# Patient Record
Sex: Female | Born: 1980 | Race: White | Hispanic: No | Smoking: Current every day smoker
Health system: Southern US, Community
[De-identification: ages and names within clinical notes are randomized; demographics above are authoritative.]

## PROBLEM LIST (undated history)

## (undated) DIAGNOSIS — I10 Essential (primary) hypertension: Secondary | ICD-10-CM

## (undated) DIAGNOSIS — M1990 Unspecified osteoarthritis, unspecified site: Secondary | ICD-10-CM

## (undated) DIAGNOSIS — J342 Deviated nasal septum: Secondary | ICD-10-CM

## (undated) DIAGNOSIS — I209 Angina pectoris, unspecified: Secondary | ICD-10-CM

## (undated) DIAGNOSIS — M719 Bursopathy, unspecified: Secondary | ICD-10-CM

## (undated) DIAGNOSIS — F419 Anxiety disorder, unspecified: Secondary | ICD-10-CM

## (undated) HISTORY — PX: WISDOM TOOTH EXTRACTION: SHX21

## (undated) HISTORY — PX: CERVIX REMOVAL: SHX592

## (undated) HISTORY — PX: APPENDECTOMY: SHX54

## (undated) HISTORY — PX: OVARIAN CYST REMOVAL: SHX89

## (undated) HISTORY — PX: HEEL SPUR SURGERY: SHX665

## (undated) HISTORY — PX: URETHRAL DILATION: SUR417

## (undated) HISTORY — PX: TUBAL LIGATION: SHX77

## (undated) HISTORY — PX: OTHER SURGICAL HISTORY: SHX169

---

## 2013-06-12 ENCOUNTER — Emergency Department (HOSPITAL_COMMUNITY)
Admission: EM | Admit: 2013-06-12 | Discharge: 2013-06-13 | Disposition: A | Discharge status: Home / Self Care | Payer: 59 | Attending: Emergency Medicine | Admitting: Emergency Medicine

## 2013-06-12 ENCOUNTER — Emergency Department (HOSPITAL_COMMUNITY): Payer: 59

## 2013-06-12 ENCOUNTER — Encounter (HOSPITAL_COMMUNITY): Payer: Self-pay | Admitting: *Deleted

## 2013-06-12 DIAGNOSIS — M7150 Other bursitis, not elsewhere classified, unspecified site: Secondary | ICD-10-CM | POA: Insufficient documentation

## 2013-06-12 DIAGNOSIS — Z79899 Other long term (current) drug therapy: Secondary | ICD-10-CM | POA: Insufficient documentation

## 2013-06-12 DIAGNOSIS — Z888 Allergy status to other drugs, medicaments and biological substances status: Secondary | ICD-10-CM | POA: Insufficient documentation

## 2013-06-12 DIAGNOSIS — M6281 Muscle weakness (generalized): Secondary | ICD-10-CM | POA: Insufficient documentation

## 2013-06-12 DIAGNOSIS — M5412 Radiculopathy, cervical region: Secondary | ICD-10-CM | POA: Insufficient documentation

## 2013-06-12 DIAGNOSIS — M5410 Radiculopathy, site unspecified: Secondary | ICD-10-CM

## 2013-06-12 DIAGNOSIS — I10 Essential (primary) hypertension: Secondary | ICD-10-CM | POA: Insufficient documentation

## 2013-06-12 DIAGNOSIS — F17200 Nicotine dependence, unspecified, uncomplicated: Secondary | ICD-10-CM | POA: Insufficient documentation

## 2013-06-12 DIAGNOSIS — R209 Unspecified disturbances of skin sensation: Secondary | ICD-10-CM | POA: Insufficient documentation

## 2013-06-12 HISTORY — DX: Bursopathy, unspecified: M71.9

## 2013-06-12 HISTORY — DX: Essential (primary) hypertension: I10

## 2013-06-12 NOTE — ED Notes (Signed)
Pt state she woke up with neck and spine pain and into right shoulder blade and has been consistent for the last five days.  Both arms keep going numb and it feels like something keeps pulling her shoulders out of her joint.  Pain goes down arm and into elbow joint and wrist

## 2013-06-13 ENCOUNTER — Encounter (HOSPITAL_COMMUNITY): Payer: Self-pay | Admitting: *Deleted

## 2013-06-13 ENCOUNTER — Emergency Department (HOSPITAL_COMMUNITY): Payer: 59

## 2013-06-13 LAB — BASIC METABOLIC PANEL
GFR calc non Af Amer: >90 mL/min (ref >90)
Glucose, Bld: 103 mg/dL — ABNORMAL HIGH (ref 70–99)
Potassium: 3.5 mEq/L (ref 3.5–5.1)
Sodium: 138 mEq/L (ref 135–145)

## 2013-06-13 LAB — CBC
Hemoglobin: 14.8 g/dL (ref 12.0–15.0)
MCHC: 35.2 g/dL (ref 30.0–36.0)
RBC: 4.62 MIL/uL (ref 3.87–5.11)
WBC: 7.1 10*3/uL (ref 4.0–10.5)

## 2013-06-13 LAB — C-REACTIVE PROTEIN: CRP: <0.5 mg/dL — ABNORMAL LOW (ref <0.60)

## 2013-06-13 LAB — SEDIMENTATION RATE: Sed Rate: 5 mm/hr (ref 0–22)

## 2013-06-13 MED ORDER — OXYCODONE-ACETAMINOPHEN 5-325 MG PO TABS
2.0000 | ORAL_TABLET | Freq: Once | ORAL | Status: AC
  Administered 2013-06-13: 2 via ORAL
  Filled 2013-06-13: qty 2

## 2013-06-13 MED ORDER — MORPHINE SULFATE 4 MG/ML IJ SOLN
4.0000 mg | Freq: Once | INTRAMUSCULAR | Status: AC
  Administered 2013-06-13: 4 mg via INTRAVENOUS
  Filled 2013-06-13: qty 1

## 2013-06-13 MED ORDER — GADOBENATE DIMEGLUMINE 529 MG/ML IV SOLN
10.0000 mL | Freq: Once | INTRAVENOUS | Status: AC
  Administered 2013-06-13: 10 mL via INTRAVENOUS

## 2013-06-13 MED ORDER — OXYCODONE-ACETAMINOPHEN 5-325 MG PO TABS: 2.0000 | ORAL_TABLET | ORAL | Status: DC | PRN

## 2013-06-13 MED ORDER — ONDANSETRON HCL 4 MG PO TABS
4.0000 mg | ORAL_TABLET | Freq: Once | ORAL | Status: AC
  Administered 2013-06-13: 4 mg via ORAL
  Filled 2013-06-13: qty 1

## 2013-06-13 MED ORDER — METHYLPREDNISOLONE 4 MG PO KIT: PACK | ORAL | Status: DC

## 2013-06-13 NOTE — ED Provider Notes (Signed)
Care assumed from Dr. Lavella Lemons at shift change awaiting mri results.  The study was performed and shows a C5-C6 disc protrusion.  I have discussed this with Dr. Gerlene Fee who will see the patient in follow up on Monday.  She is to call to schedule this appointment.    Geoffery Lyons, MD 06/13/13 1004

## 2013-06-13 NOTE — ED Notes (Signed)
Report taken on Patient, will attempt to move patient to pod C because of delay in MRI.

## 2013-06-13 NOTE — ED Notes (Signed)
Pt states with movement there is still some pain; however, the nausea has diminished.

## 2013-06-13 NOTE — ED Provider Notes (Signed)
History    CSN: [id number] Arrival date & time 06/12/13  1839  First MD Initiated Contact with Patient 06/12/13 2357     Chief Complaint  Patient presents with  . Shoulder Pain   (Consider location/radiation/quality/duration/timing/severity/associated sxs/prior Treatment) HPI Patient is a 32 yo woman who awoke 5d ago with pain in midline of the c spine which radiated to the right shoulder. Pain has been severe and persistent since then. Pain radiates to the elbow and wrist. Patient feels numbness in in both arms to the ends of each of her finger tips. The patient feels weakness in her right arm. Her grip strength is diminished. She feels like her right shoulder is less elevated than her left when she looks in the mirror.   She has worsening of pain with movements of the neck. However, her pain is constant. Denies recent injury. Says she felt "a little feverish" today. Notes a remote history of c spine fx - approx 8 yrs ago - but says this was not treated surgically.    Past Medical History  Diagnosis Date  . Bursitis   . Hypertension    Past Surgical History  Procedure Laterality Date  . Fractured vertebrate in neck     No family history on file. History  Substance Use Topics  . Smoking status: Current Every Day Smoker  . Smokeless tobacco: Not on file  . Alcohol Use: No   OB History   Grav Para Term Preterm Abortions TAB SAB Ect Mult Living                 Review of Systems Gen: no weight loss, fevers, chills, night sweats Eyes: no discharge or drainage, no occular pain or visual changes Nose: no epistaxis or rhinorrhea Mouth: no dental pain, no sore throat Neck: no neck pain Lungs: no SOB, cough, wheezing CV: no chest pain, palpitations, dependent edema or orthopnea Abd: no abdominal pain, nausea, vomiting GU: no dysuria or gross hematuria MSK: As per history of present illness, otherwise negative Neuro: As per history of present illness, otherwise  negative Skin: no rash Psyche: negative.  Allergies  Amlodipine  Home Medications   Current Outpatient Rx  Name  Route  Sig  Dispense  Refill  . diazepam (VALIUM) 5 MG tablet   Oral   Take 5 mg by mouth every 6 (six) hours as needed (for muscle spasms).         Marland Kitchen ibuprofen (ADVIL,MOTRIN) 200 MG tablet   Oral   Take 400 mg by mouth every 6 (six) hours as needed for pain.         . metoprolol tartrate (LOPRESSOR) 25 MG tablet   Oral   Take 25 mg by mouth daily.          BP 166/104  Pulse 77  Temp(Src) 98.7 F (37.1 C) (Oral)  Resp 20  SpO2 100%  LMP 06/09/2013 Physical Exam Gen: well developed and well nourished appearing Head: NCAT Eyes: PERL, EOMI Nose: no epistaixis or rhinorrhea Mouth/throat: mucosa is moist and pink Neck: no midline c spine ttp Lungs: CTA B, no wheezing, rhonchi or rales CV: RRR, no murmur Abd: soft, notender, nondistended Back: ttp over the superior T spine approx T1-T3 level with ttp over the right paraspinal muscualture Skin: no rashese, wnl Neuro: CN ii-xii grossly intact, UE: normal shoulder elevation bilaterally, 5/5 biceps left , 4+ biceps right, 5/5 triceps left, 4+/5 triceps left, mildly diminished finger and hand strength right, normal left,  normal DTRs bilaterally, LE: 5/5 motor strength bilaterally all major muscle groups.  Psyche; normal affect,  calm and cooperative.   ED Course  Procedures (including critical care time) Labs Reviewed - No data to display Dg Shoulder Right  06/12/2013   *RADIOLOGY REPORT*  Clinical Data: Pain and numbness  RIGHT SHOULDER - 2+ VIEW  Comparison: None.  Findings: Negative for fracture, dislocation, or other acute abnormality.  Normal alignment and mineralization. No significant degenerative change.  Regional soft tissues unremarkable.  IMPRESSION:  Negative   Original Report Authenticated By: D. Andria Rhein, MD   No diagnosis found.  MDM  Patient with exam concern for acute spinal cord  compression or myelopathy. MRI of the C and T spine are pending. Case signed out to Dr. Laveda Norman at change of shift who will follow up on results of MRI and disposition patient.    Brandt Loosen, MD 06/13/13 [phone]

## 2013-06-13 NOTE — ED Notes (Signed)
Patient is complaining that her shoulder pain is returning.  Denies any chest pain.  Aware of plan to have MRI this morning.  Will request additional pain medications

## 2013-06-13 NOTE — ED Notes (Signed)
Patient remains in MRI,  Additional pain meds given due to patient having difficulty laying still for MRI.

## 2013-06-18 ENCOUNTER — Emergency Department (HOSPITAL_COMMUNITY)
Admission: EM | Admit: 2013-06-18 | Discharge: 2013-06-19 | Disposition: A | Discharge status: Home / Self Care | Payer: 59 | Attending: Emergency Medicine | Admitting: Emergency Medicine

## 2013-06-18 ENCOUNTER — Encounter (HOSPITAL_COMMUNITY): Payer: Self-pay | Admitting: *Deleted

## 2013-06-18 ENCOUNTER — Emergency Department (HOSPITAL_COMMUNITY): Payer: 59

## 2013-06-18 DIAGNOSIS — Z888 Allergy status to other drugs, medicaments and biological substances status: Secondary | ICD-10-CM | POA: Insufficient documentation

## 2013-06-18 DIAGNOSIS — I10 Essential (primary) hypertension: Secondary | ICD-10-CM | POA: Insufficient documentation

## 2013-06-18 DIAGNOSIS — M549 Dorsalgia, unspecified: Secondary | ICD-10-CM | POA: Insufficient documentation

## 2013-06-18 DIAGNOSIS — Z8739 Personal history of other diseases of the musculoskeletal system and connective tissue: Secondary | ICD-10-CM | POA: Insufficient documentation

## 2013-06-18 DIAGNOSIS — R209 Unspecified disturbances of skin sensation: Secondary | ICD-10-CM | POA: Insufficient documentation

## 2013-06-18 DIAGNOSIS — M542 Cervicalgia: Secondary | ICD-10-CM | POA: Insufficient documentation

## 2013-06-18 DIAGNOSIS — F17200 Nicotine dependence, unspecified, uncomplicated: Secondary | ICD-10-CM | POA: Insufficient documentation

## 2013-06-18 DIAGNOSIS — Z79899 Other long term (current) drug therapy: Secondary | ICD-10-CM | POA: Insufficient documentation

## 2013-06-18 LAB — BASIC METABOLIC PANEL
CO2: 29 mEq/L (ref 19–32)
Calcium: 9.2 mg/dL (ref 8.4–10.5)
Glucose, Bld: 109 mg/dL — ABNORMAL HIGH (ref 70–99)
Potassium: 3.9 mEq/L (ref 3.5–5.1)
Sodium: 138 mEq/L (ref 135–145)

## 2013-06-18 LAB — CBC
Hemoglobin: 15.3 g/dL — ABNORMAL HIGH (ref 12.0–15.0)
MCH: 32.1 pg (ref 26.0–34.0)
RBC: 4.76 MIL/uL (ref 3.87–5.11)

## 2013-06-18 MED ORDER — OXYCODONE-ACETAMINOPHEN 5-325 MG PO TABS: 1.0000 | ORAL_TABLET | Freq: Four times a day (QID) | ORAL | Status: AC | PRN

## 2013-06-18 MED ORDER — OXYCODONE-ACETAMINOPHEN 5-325 MG PO TABS
2.0000 | ORAL_TABLET | Freq: Once | ORAL | Status: AC
  Administered 2013-06-18: 2 via ORAL
  Filled 2013-06-18: qty 2

## 2013-06-18 NOTE — ED Provider Notes (Signed)
History    CSN: [id number] Arrival date & time 06/18/13  2040  First MD Initiated Contact with Patient 06/18/13 2150     Chief Complaint  Patient presents with  . Back Pain  . Chest Pain   (Consider location/radiation/quality/duration/timing/severity/associated sxs/prior Treatment) Patient is a 32 y.o. female presenting with back pain.  Back Pain Pain location: neck. Quality: sharp. Radiates to: right shoulder, right chest. Pain severity:  Severe Onset quality:  Sudden Duration:  7 days Timing:  Constant Progression:  Worsening Context comment:  Evaluated in ED several days ago found to have cervical disc protrusion Relieved by: percocet. Exacerbated by: movement. Associated symptoms: tingling   Associated symptoms: no abdominal pain, no bladder incontinence, no bowel incontinence, no chest pain, no fever, no numbness and no weakness    Past Medical History  Diagnosis Date  . Bursitis   . Hypertension    Past Surgical History  Procedure Laterality Date  . Fractured vertebrate in neck    . Tubal ligation    . Ovarian cyst removal    . Cervix removal      states portioon  . Urethral dilation    . Heel spur surgery     No family history on file. History  Substance Use Topics  . Smoking status: Current Every Day Smoker  . Smokeless tobacco: Not on file  . Alcohol Use: No   OB History   Grav Para Term Preterm Abortions TAB SAB Ect Mult Living                 Review of Systems  Constitutional: Negative for fever.  HENT: Negative for congestion.   Respiratory: Negative for cough and shortness of breath.   Cardiovascular: Negative for chest pain.  Gastrointestinal: Negative for nausea, vomiting, abdominal pain, diarrhea and bowel incontinence.  Genitourinary: Negative for bladder incontinence.  Musculoskeletal: Positive for back pain.  Neurological: Positive for tingling. Negative for weakness and numbness.  All other systems reviewed and are  negative.    Allergies  Amlodipine  Home Medications   Current Outpatient Rx  Name  Route  Sig  Dispense  Refill  . diazepam (VALIUM) 5 MG tablet   Oral   Take 5 mg by mouth every 6 (six) hours as needed (for muscle spasms).         Marland Kitchen ibuprofen (ADVIL,MOTRIN) 200 MG tablet   Oral   Take 400 mg by mouth every 6 (six) hours as needed for pain.         . methylPREDNISolone (MEDROL DOSEPAK) 4 MG tablet      Take as directed   21 tablet   0   . metoprolol tartrate (LOPRESSOR) 25 MG tablet   Oral   Take 25 mg by mouth daily.         Marland Kitchen oxyCODONE-acetaminophen (PERCOCET) 5-325 MG per tablet   Oral   Take 2 tablets by mouth every 4 (four) hours as needed for pain.   16 tablet   0    BP 193/114  Pulse 103  Temp(Src) 98.4 F (36.9 C) (Oral)  Resp 16  SpO2 94%  LMP 06/09/2013 Physical Exam  Nursing note and vitals reviewed. Constitutional: She is oriented to person, place, and time. She appears well-developed and well-nourished. No distress.  HENT:  Head: Normocephalic and atraumatic.  Mouth/Throat: Oropharynx is clear and moist.  Eyes: Conjunctivae are normal. Pupils are equal, round, and reactive to light. No scleral icterus.  Neck: Neck supple. Spinous process  tenderness and muscular tenderness present. Decreased range of motion (due to pain) present.  Cardiovascular: Normal rate, regular rhythm and intact distal pulses.   Pulmonary/Chest: Effort normal. No stridor. No respiratory distress.  Abdominal: Soft. Bowel sounds are normal.  Neurological: She is alert and oriented to person, place, and time. She has normal strength. No sensory deficit. Gait normal.  Normal strength and sensation of bilateral upper extremities.    Skin: Skin is warm and dry. No rash noted.  Psychiatric: She has a normal mood and affect. Her behavior is normal.    ED Course  Procedures (including critical care time) Labs Reviewed  CBC - Abnormal; Notable for the following:     Hemoglobin 15.3 (*)    All other components within normal limits  BASIC METABOLIC PANEL - Abnormal; Notable for the following:    Glucose, Bld 109 (*)    GFR calc non Af Amer 89 (*)    All other components within normal limits   Dg Chest 2 View  06/18/2013   *RADIOLOGY REPORT*  Clinical Data: Back pain and chest pain.  CHEST - 2 VIEW  Comparison: MRI of the thoracic spine 06/13/2013.  Findings: The heart size is normal.  The lungs are clear.  The visualized soft tissues and bony thorax are unremarkable.  IMPRESSION: Negative two-view chest.   Original Report Authenticated By: Marin Roberts, M.D.   EKG - NSR, rate 92, rightward axis, normal intervals, no ST elevations or depressions, similar to prior.    1. Neck pain     MDM  32 yo female with neck pain for 1 week with disc protrusion seen on MR several days ago.  She has follow up with neurosurgery tomorrow.  She ran out of pain meds today and pain has since been intolerable.  Normal strength and sensation.  No fevers or other red flags.  Percocet improved pain in ED.  Workup for chest pain initiated in triage, however pt's chest pain is actually neck pain radiating to chest.  No concern for ACS or PE.  Given enough percocet to make it to appointment tomorrow.  Return precautions given.   Candyce Churn, MD 06/19/13 [phone]

## 2013-06-18 NOTE — ED Notes (Signed)
Pt c/o back pain that she was seen on Monday for. She has appointment with neurosurgeon tomorrow. PAin radiates into her chest, with nausea, HA, lightheadedness and weakness.

## 2013-06-19 ENCOUNTER — Other Ambulatory Visit: Payer: Self-pay | Admitting: Neurosurgery

## 2013-06-19 NOTE — ED Notes (Signed)
Pt has ride home.

## 2013-06-20 ENCOUNTER — Encounter (HOSPITAL_COMMUNITY): Payer: Self-pay | Admitting: Pharmacy Technician

## 2013-06-26 NOTE — Pre-Procedure Instructions (Signed)
Zilah Villaflor  06/26/2013   Your procedure is scheduled on: June 30, 2013 at 12:56 PM  Report to [facility] at 11:00 AM.  Call this number if you have problems the morning of surgery: [phone]   Remember:   Do not eat food or drink liquids after midnight.   Take these medicines the morning of surgery with A SIP OF WATER: cetirizine (ZYRTEC), gabapentin (NEURONTIN), metoprolol tartrate (LOPRESSOR), oxyCODONE-acetaminophen (PERCOCET/ROXICET)       Do not wear jewelry, make-up or nail polish.  Do not wear lotions, powders, or perfumes. You may wear deodorant.  Do not shave 48 hours prior to surgery.   Do not bring valuables to the hospital.  [facility] is not responsible                   for any belongings or valuables.  Contacts, dentures or bridgework may not be worn into surgery.  Leave suitcase in the car. After surgery it may be brought to your room.  For patients admitted to the hospital, checkout time is 11:00 AM the day of  discharge.    Special Instructions: Shower using CHG 2 nights before surgery and the night before surgery.  If you shower the day of surgery use CHG.  Use special wash - you have one bottle of CHG for all showers.  You should use approximately 1/3 of the bottle for each shower.   Please read over the following fact sheets that you were given: Pain Booklet, Coughing and Deep Breathing, MRSA Information and Surgical Site Infection Prevention

## 2013-06-27 ENCOUNTER — Encounter (HOSPITAL_COMMUNITY)
Admission: RE | Admit: 2013-06-27 | Discharge: 2013-06-27 | Disposition: A | Discharge status: Home / Self Care | Payer: 59 | Source: Ambulatory Visit | Attending: Neurosurgery | Admitting: Neurosurgery

## 2013-06-27 ENCOUNTER — Encounter (HOSPITAL_COMMUNITY): Payer: Self-pay

## 2013-06-27 HISTORY — DX: Deviated nasal septum: J34.2

## 2013-06-27 HISTORY — DX: Anxiety disorder, unspecified: F41.9

## 2013-06-27 HISTORY — DX: Unspecified osteoarthritis, unspecified site: M19.90

## 2013-06-27 HISTORY — DX: Angina pectoris, unspecified: I20.9

## 2013-06-27 LAB — HCG, SERUM, QUALITATIVE: Preg, Serum: NEGATIVE

## 2013-06-27 NOTE — Progress Notes (Signed)
SPOKE WITH ALLISON ABOUT PATIENT BEING IN ED 06/18/13 FOR PAIN IN CHEST, NECK, BACK  WAS EVALUATED WITH CXR, EKG,LABS.  WAS THOUGHT TO BE HAVING MUSCLE PAIN.  WILL REQUEST ECHO, LAST OV, EKG FROM PATIENT'S MEDICAL PCP IN [place] (DR. Herbert Moors)  [phone].

## 2013-06-28 ENCOUNTER — Encounter (HOSPITAL_COMMUNITY): Payer: Self-pay

## 2013-06-28 NOTE — Progress Notes (Signed)
Anesthesia Chart Review:  Patient is a 32 year old female scheduled for C5-6, C6-7 ACDF on 06/30/13 by Dr. Beverlyn Roux. She recently moved to the area and does not have a PCP yet.  History includes smoking, anxiety, HTN, arthritis, deviated septum, c-spine fracture ~ 8 years ago that was not treated surgically.  Of note, she was evaluated in the ED on 06/19/13 for neck pain and chest pain after running out of pain meds.  She was evaluated by Dr. Blake Divine (notes in Epic) who described her chest pain as radiating from her neck.  Pain was felt likely due to cervical radiculopathy.  He felt there was "no concern for ACS or PE" and she was treated for pain medications as she already had appointment with NS the next day.   EKG on 06/18/13 showed NSR, RAE, rightward axis, pulmonary disease pattern, non-specific ST abnormality.    Echo on 11/18/12 (for evaluation of palpitations) done at [facility] (ordered by her PCP at the time Dr. Wilnette Kales) showed: Grossly normal LV size, normal wall motion, EF 60-65%.  Normal RV size and systolic function.  Small LA.  No ASD.  Trace MR.  Mild TR.  MRI of the C/T spine on 06/13/13 showed: C5-6 broad-based protrusion with slight caudal extension. Spinal stenosis with mild cord flattening. Mild bilateral foraminal narrowing. C6-7 broad-based protrusion slightly more notable to the right. Spinal stenosis and cord flattening greater on the right. Moderate right-sided and mild left-sided foraminal narrowing. Altered signal intensity surrounding the C6-7 disc space most likely degenerative in origin as noted above. T6-7 small left paracentral cephalad extending disc protrusion with mild left-sided cord flattening. T7-8 tiny left paracentral protrusion. T8-9 bulge/shallow protrusion slightly greater to left. Minimal curvature thoracic spine. Top normal size subcarinal lymph node with short axis dimension of 1 cm.  She had a negative 2V CXR on 06/18/13.  Labs from  06/18/13 and 06/27/13 noted.  Patient will be evaluated by her assigned anesthesiologist on the day of surgery.  Anticipate that she will be able to proceed as planned.  Velna Ochs [facility]/Anesthesiology Phone [phone] 06/28/2013 11:09 AM

## 2013-06-29 MED ORDER — CEFAZOLIN SODIUM-DEXTROSE 2-3 GM-% IV SOLR
2.0000 g | INTRAVENOUS | Status: AC
  Administered 2013-06-30: 2 g via INTRAVENOUS
  Filled 2013-06-29: qty 50

## 2013-06-30 ENCOUNTER — Ambulatory Visit (HOSPITAL_COMMUNITY): Payer: 59 | Admitting: Anesthesiology

## 2013-06-30 ENCOUNTER — Ambulatory Visit (HOSPITAL_COMMUNITY)
Admission: RE | Admit: 2013-06-30 | Discharge: 2013-07-01 | Disposition: A | Discharge status: Home / Self Care | Payer: 59 | Source: Ambulatory Visit | Attending: Neurosurgery | Admitting: Neurosurgery

## 2013-06-30 ENCOUNTER — Encounter (HOSPITAL_COMMUNITY)
Admission: RE | Disposition: A | Discharge status: Home / Self Care | Payer: Self-pay | Source: Ambulatory Visit | Attending: Neurosurgery

## 2013-06-30 ENCOUNTER — Encounter (HOSPITAL_COMMUNITY): Payer: Self-pay | Admitting: Surgery

## 2013-06-30 ENCOUNTER — Encounter (HOSPITAL_COMMUNITY): Payer: Self-pay | Admitting: Vascular Surgery

## 2013-06-30 ENCOUNTER — Ambulatory Visit (HOSPITAL_COMMUNITY): Payer: 59

## 2013-06-30 DIAGNOSIS — M5020 Other cervical disc displacement, unspecified cervical region: Secondary | ICD-10-CM | POA: Insufficient documentation

## 2013-06-30 DIAGNOSIS — Z79899 Other long term (current) drug therapy: Secondary | ICD-10-CM | POA: Insufficient documentation

## 2013-06-30 DIAGNOSIS — I10 Essential (primary) hypertension: Secondary | ICD-10-CM | POA: Insufficient documentation

## 2013-06-30 HISTORY — PX: ANTERIOR CERVICAL DECOMP/DISCECTOMY FUSION: SHX1161

## 2013-06-30 SURGERY — ANTERIOR CERVICAL DECOMPRESSION/DISCECTOMY FUSION 2 LEVELS
Anesthesia: General | Site: Neck | Wound class: Clean

## 2013-06-30 MED ORDER — GABAPENTIN 100 MG PO CAPS
200.0000 mg | ORAL_CAPSULE | Freq: Every day | ORAL | Status: DC
  Administered 2013-06-30: 200 mg via ORAL
  Filled 2013-06-30 (×2): qty 2

## 2013-06-30 MED ORDER — LORATADINE 10 MG PO TABS
10.0000 mg | ORAL_TABLET | Freq: Every day | ORAL | Status: DC
  Filled 2013-06-30: qty 1

## 2013-06-30 MED ORDER — LACTATED RINGERS IV SOLN
INTRAVENOUS | Status: DC | PRN
  Administered 2013-06-30 (×3): via INTRAVENOUS

## 2013-06-30 MED ORDER — THROMBIN 5000 UNITS EX SOLR
CUTANEOUS | Status: DC | PRN
  Administered 2013-06-30 (×2): 5000 [IU] via TOPICAL

## 2013-06-30 MED ORDER — HYDROMORPHONE HCL PF 1 MG/ML IJ SOLN: 0.2500 mg | INTRAMUSCULAR | Status: DC | PRN

## 2013-06-30 MED ORDER — METHOCARBAMOL 500 MG PO TABS
500.0000 mg | ORAL_TABLET | Freq: Four times a day (QID) | ORAL | Status: DC | PRN
  Administered 2013-06-30 – 2013-07-01 (×2): 500 mg via ORAL
  Filled 2013-06-30: qty 1

## 2013-06-30 MED ORDER — ROCURONIUM BROMIDE 100 MG/10ML IV SOLN
INTRAVENOUS | Status: DC | PRN
  Administered 2013-06-30: 50 mg via INTRAVENOUS

## 2013-06-30 MED ORDER — LACTATED RINGERS IV SOLN
INTRAVENOUS | Status: DC
  Administered 2013-06-30: 12:00:00 via INTRAVENOUS

## 2013-06-30 MED ORDER — SODIUM CHLORIDE 0.9 % IJ SOLN
3.0000 mL | Freq: Two times a day (BID) | INTRAMUSCULAR | Status: DC
  Administered 2013-06-30: 3 mL via INTRAVENOUS

## 2013-06-30 MED ORDER — DEXAMETHASONE 4 MG PO TABS
4.0000 mg | ORAL_TABLET | Freq: Four times a day (QID) | ORAL | Status: AC
  Administered 2013-06-30 (×2): 4 mg via ORAL
  Filled 2013-06-30 (×2): qty 1

## 2013-06-30 MED ORDER — SODIUM CHLORIDE 0.9 % IJ SOLN: 3.0000 mL | INTRAMUSCULAR | Status: DC | PRN

## 2013-06-30 MED ORDER — ACETAMINOPHEN 650 MG RE SUPP: 650.0000 mg | RECTAL | Status: DC | PRN

## 2013-06-30 MED ORDER — PROPOFOL 10 MG/ML IV BOLUS
INTRAVENOUS | Status: DC | PRN
  Administered 2013-06-30: 200 mg via INTRAVENOUS

## 2013-06-30 MED ORDER — OXYCODONE HCL 5 MG PO TABS
ORAL_TABLET | ORAL | Status: AC
  Filled 2013-06-30: qty 1

## 2013-06-30 MED ORDER — MIDAZOLAM HCL 2 MG/2ML IJ SOLN: 0.5000 mg | Freq: Once | INTRAMUSCULAR | Status: DC | PRN

## 2013-06-30 MED ORDER — OXYCODONE HCL 5 MG PO TABS
5.0000 mg | ORAL_TABLET | Freq: Once | ORAL | Status: AC | PRN
  Administered 2013-06-30: 5 mg via ORAL

## 2013-06-30 MED ORDER — GABAPENTIN 100 MG PO CAPS
100.0000 mg | ORAL_CAPSULE | Freq: Every day | ORAL | Status: DC
  Administered 2013-06-30: 100 mg via ORAL
  Filled 2013-06-30 (×2): qty 1

## 2013-06-30 MED ORDER — SODIUM CHLORIDE 0.9 % IV SOLN
INTRAVENOUS | Status: AC
  Filled 2013-06-30: qty 500

## 2013-06-30 MED ORDER — DEXAMETHASONE SODIUM PHOSPHATE 10 MG/ML IJ SOLN
10.0000 mg | INTRAMUSCULAR | Status: DC
  Filled 2013-06-30: qty 1

## 2013-06-30 MED ORDER — METOPROLOL TARTRATE 25 MG PO TABS
25.0000 mg | ORAL_TABLET | Freq: Two times a day (BID) | ORAL | Status: DC
  Filled 2013-06-30 (×3): qty 1

## 2013-06-30 MED ORDER — PHENOL 1.4 % MT LIQD: 1.0000 | OROMUCOSAL | Status: DC | PRN

## 2013-06-30 MED ORDER — BACITRACIN 50000 UNITS IM SOLR
INTRAMUSCULAR | Status: AC
  Filled 2013-06-30: qty 1

## 2013-06-30 MED ORDER — FLUTICASONE PROPIONATE 50 MCG/ACT NA SUSP
2.0000 | Freq: Every day | NASAL | Status: DC
  Filled 2013-06-30: qty 16

## 2013-06-30 MED ORDER — HEMOSTATIC AGENTS (NO CHARGE) OPTIME
TOPICAL | Status: DC | PRN
  Administered 2013-06-30: 1 via TOPICAL

## 2013-06-30 MED ORDER — KCL IN DEXTROSE-NACL 20-5-0.45 MEQ/L-%-% IV SOLN
80.0000 mL/h | INTRAVENOUS | Status: DC
  Filled 2013-06-30 (×3): qty 1000

## 2013-06-30 MED ORDER — 0.9 % SODIUM CHLORIDE (POUR BTL) OPTIME
TOPICAL | Status: DC | PRN
  Administered 2013-06-30: 1000 mL

## 2013-06-30 MED ORDER — PROMETHAZINE HCL 25 MG/ML IJ SOLN: 6.2500 mg | INTRAMUSCULAR | Status: DC | PRN

## 2013-06-30 MED ORDER — ACETAMINOPHEN 325 MG PO TABS: 650.0000 mg | ORAL_TABLET | ORAL | Status: DC | PRN

## 2013-06-30 MED ORDER — LIDOCAINE HCL (CARDIAC) 20 MG/ML IV SOLN
INTRAVENOUS | Status: DC | PRN
  Administered 2013-06-30: 30 mg via INTRAVENOUS

## 2013-06-30 MED ORDER — DEXAMETHASONE SODIUM PHOSPHATE 4 MG/ML IJ SOLN: 4.0000 mg | Freq: Four times a day (QID) | INTRAMUSCULAR | Status: AC

## 2013-06-30 MED ORDER — CEFAZOLIN SODIUM 1-5 GM-% IV SOLN
1.0000 g | Freq: Three times a day (TID) | INTRAVENOUS | Status: AC
  Administered 2013-06-30 – 2013-07-01 (×2): 1 g via INTRAVENOUS
  Filled 2013-06-30 (×3): qty 50

## 2013-06-30 MED ORDER — HYDROMORPHONE HCL PF 1 MG/ML IJ SOLN
1.0000 mg | INTRAMUSCULAR | Status: DC | PRN
  Administered 2013-06-30: 1.5 mg via INTRAMUSCULAR
  Filled 2013-06-30: qty 2

## 2013-06-30 MED ORDER — METHOCARBAMOL 500 MG PO TABS
ORAL_TABLET | ORAL | Status: AC
  Filled 2013-06-30: qty 1

## 2013-06-30 MED ORDER — FENTANYL CITRATE 0.05 MG/ML IJ SOLN
INTRAMUSCULAR | Status: DC | PRN
  Administered 2013-06-30 (×4): 50 ug via INTRAVENOUS
  Administered 2013-06-30: 100 ug via INTRAVENOUS
  Administered 2013-06-30 (×4): 50 ug via INTRAVENOUS

## 2013-06-30 MED ORDER — ONDANSETRON HCL 4 MG/2ML IJ SOLN: 4.0000 mg | INTRAMUSCULAR | Status: DC | PRN

## 2013-06-30 MED ORDER — OXYCODONE HCL 5 MG/5ML PO SOLN: 5.0000 mg | Freq: Once | ORAL | Status: AC | PRN

## 2013-06-30 MED ORDER — DEXTROSE 5 % IV SOLN
500.0000 mg | Freq: Four times a day (QID) | INTRAVENOUS | Status: DC | PRN
  Filled 2013-06-30: qty 5

## 2013-06-30 MED ORDER — MENTHOL 3 MG MT LOZG
1.0000 | LOZENGE | OROMUCOSAL | Status: DC | PRN
  Administered 2013-06-30: 3 mg via ORAL
  Filled 2013-06-30: qty 9

## 2013-06-30 MED ORDER — HYDROMORPHONE HCL PF 1 MG/ML IJ SOLN
INTRAMUSCULAR | Status: AC
  Filled 2013-06-30: qty 1

## 2013-06-30 MED ORDER — BACITRACIN 50000 UNITS IM SOLR
INTRAMUSCULAR | Status: DC | PRN
  Administered 2013-06-30: 14:00:00

## 2013-06-30 MED ORDER — OXYCODONE-ACETAMINOPHEN 5-325 MG PO TABS
1.0000 | ORAL_TABLET | ORAL | Status: DC | PRN
  Administered 2013-06-30 – 2013-07-01 (×2): 2 via ORAL
  Filled 2013-06-30 (×2): qty 2

## 2013-06-30 MED ORDER — MIDAZOLAM HCL 5 MG/5ML IJ SOLN
INTRAMUSCULAR | Status: DC | PRN
  Administered 2013-06-30: 2 mg via INTRAVENOUS

## 2013-06-30 MED ORDER — DEXAMETHASONE SODIUM PHOSPHATE 10 MG/ML IJ SOLN
INTRAMUSCULAR | Status: DC | PRN
  Administered 2013-06-30: 10 mg via INTRAVENOUS

## 2013-06-30 MED ORDER — MEPERIDINE HCL 25 MG/ML IJ SOLN: 6.2500 mg | INTRAMUSCULAR | Status: DC | PRN

## 2013-06-30 SURGICAL SUPPLY — 57 items
BAG DECANTER FOR FLEXI CONT (MISCELLANEOUS) ×2 IMPLANT
BENZOIN TINCTURE PRP APPL 2/3 (GAUZE/BANDAGES/DRESSINGS) ×2 IMPLANT
BIT DRILL TRINICA 2.3MM (BIT) ×1 IMPLANT
BRUSH SCRUB EZ PLAIN DRY (MISCELLANEOUS) ×2 IMPLANT
BUR MATCHSTICK NEURO 3.0 LAGG (BURR) ×2 IMPLANT
CANISTER SUCTION 2500CC (MISCELLANEOUS) ×2 IMPLANT
CLOTH BEACON ORANGE TIMEOUT ST (SAFETY) ×2 IMPLANT
CONT SPEC 4OZ CLIKSEAL STRL BL (MISCELLANEOUS) ×2 IMPLANT
DRAPE C-ARM 42X72 X-RAY (DRAPES) ×4 IMPLANT
DRAPE LAPAROTOMY 100X72 PEDS (DRAPES) ×2 IMPLANT
DRAPE MICROSCOPE ZEISS OPMI (DRAPES) ×2 IMPLANT
DRAPE SURG 17X23 STRL (DRAPES) ×4 IMPLANT
DRESSING TELFA 8X3 (GAUZE/BANDAGES/DRESSINGS) ×2 IMPLANT
DRILL BIT TRINICA 2.3MM (BIT) ×2
DURAPREP 6ML APPLICATOR 50/CS (WOUND CARE) ×2 IMPLANT
ELECT COATED BLADE 2.86 ST (ELECTRODE) ×2 IMPLANT
ELECT REM PT RETURN 9FT ADLT (ELECTROSURGICAL) ×2
ELECTRODE REM PT RTRN 9FT ADLT (ELECTROSURGICAL) ×1 IMPLANT
GAUZE SPONGE 4X4 16PLY XRAY LF (GAUZE/BANDAGES/DRESSINGS) IMPLANT
GLOVE BIOGEL PI IND STRL 7.0 (GLOVE) ×3 IMPLANT
GLOVE BIOGEL PI INDICATOR 7.0 (GLOVE) ×3
GLOVE ECLIPSE 7.5 STRL STRAW (GLOVE) IMPLANT
GLOVE ECLIPSE 8.0 STRL XLNG CF (GLOVE) ×4 IMPLANT
GLOVE EXAM NITRILE LRG STRL (GLOVE) IMPLANT
GLOVE EXAM NITRILE MD LF STRL (GLOVE) IMPLANT
GLOVE EXAM NITRILE XL STR (GLOVE) IMPLANT
GLOVE EXAM NITRILE XS STR PU (GLOVE) IMPLANT
GLOVE SURG SS PI 7.0 STRL IVOR (GLOVE) ×6 IMPLANT
GOWN BRE IMP SLV AUR LG STRL (GOWN DISPOSABLE) IMPLANT
GOWN BRE IMP SLV AUR XL STRL (GOWN DISPOSABLE) IMPLANT
GOWN STRL REIN 2XL LVL4 (GOWN DISPOSABLE) ×2 IMPLANT
HEAD HALTER (SOFTGOODS) ×2 IMPLANT
KIT BASIN OR (CUSTOM PROCEDURE TRAY) ×2 IMPLANT
KIT ROOM TURNOVER OR (KITS) ×2 IMPLANT
NEEDLE SPNL 20GX3.5 QUINCKE YW (NEEDLE) ×2 IMPLANT
NS IRRIG 1000ML POUR BTL (IV SOLUTION) ×2 IMPLANT
PACK LAMINECTOMY NEURO (CUSTOM PROCEDURE TRAY) ×2 IMPLANT
PAD ARMBOARD 7.5X6 YLW CONV (MISCELLANEOUS) ×2 IMPLANT
PATTIES SURGICAL .75X.75 (GAUZE/BANDAGES/DRESSINGS) ×2 IMPLANT
PLATE 38MM (Plate) ×2 IMPLANT
PUTTY BONE GRAFT KIT 2.5ML (Bone Implant) ×2 IMPLANT
RUBBERBAND STERILE (MISCELLANEOUS) ×4 IMPLANT
SCREW SD FIXED 12MM (Screw) ×12 IMPLANT
SPACER TM-S 11X14X5-7 (Spacer) ×4 IMPLANT
SPONGE GAUZE 4X4 12PLY (GAUZE/BANDAGES/DRESSINGS) ×2 IMPLANT
SPONGE INTESTINAL PEANUT (DISPOSABLE) ×2 IMPLANT
SPONGE SURGIFOAM ABS GEL SZ50 (HEMOSTASIS) ×2 IMPLANT
STRIP CLOSURE SKIN 1/2X4 (GAUZE/BANDAGES/DRESSINGS) ×2 IMPLANT
SUT PDS AB 5-0 P3 18 (SUTURE) ×2 IMPLANT
SUT VIC AB 3-0 CP2 18 (SUTURE) ×2 IMPLANT
SYR 20ML ECCENTRIC (SYRINGE) IMPLANT
TAPE CLOTH SURG 4X10 WHT LF (GAUZE/BANDAGES/DRESSINGS) ×2 IMPLANT
TAPE STRIPS DRAPE STRL (GAUZE/BANDAGES/DRESSINGS) ×2 IMPLANT
TOWEL OR 17X24 6PK STRL BLUE (TOWEL DISPOSABLE) ×2 IMPLANT
TOWEL OR 17X26 10 PK STRL BLUE (TOWEL DISPOSABLE) ×2 IMPLANT
TRAP SPECIMEN MUCOUS 40CC (MISCELLANEOUS) IMPLANT
WATER STERILE IRR 1000ML POUR (IV SOLUTION) ×2 IMPLANT

## 2013-06-30 NOTE — Anesthesia Preprocedure Evaluation (Addendum)
Anesthesia Evaluation  Patient identified by MRN, date of birth, ID band Patient awake    Reviewed: Allergy & Precautions, H&P , NPO status , Patient's Chart, lab work & pertinent test results, reviewed documented beta blocker date and time   History of Anesthesia Complications Negative for: history of anesthetic complications  Airway Mallampati: II TM Distance: >3 FB Neck ROM: Full    Dental  (+) Missing, Poor Dentition and Dental Advisory Given   Pulmonary Current Smoker,  breath sounds clear to auscultation  Pulmonary exam normal       Cardiovascular hypertension, Pt. on medications and Pt. on home beta blockers - anginaRhythm:Regular Rate:Normal  '13 ECHO: normal LVF, normal valves   Neuro/Psych Anxiety Chronic back pain: daily narcotics    GI/Hepatic Neg liver ROS, GERD-  Controlled,  Endo/Other  negative endocrine ROS  Renal/GU negative Renal ROS     Musculoskeletal   Abdominal   Peds  Hematology negative hematology ROS (+)   Anesthesia Other Findings   Reproductive/Obstetrics BTL, LMP 2 weeks ago                          Anesthesia Physical Anesthesia Plan  ASA: II  Anesthesia Plan: General   Post-op Pain Management:    Induction: Intravenous  Airway Management Planned: Oral ETT  Additional Equipment:   Intra-op Plan:   Post-operative Plan: Extubation in OR  Informed Consent: I have reviewed the patients History and Physical, chart, labs and discussed the procedure including the risks, benefits and alternatives for the proposed anesthesia with the patient or authorized representative who has indicated his/her understanding and acceptance.   Dental advisory given  Plan Discussed with: CRNA and Surgeon  Anesthesia Plan Comments: (Plan routine monitors, GETA)        Anesthesia Quick Evaluation

## 2013-06-30 NOTE — Anesthesia Procedure Notes (Signed)
Procedure Name: Intubation Date/Time: 06/30/2013 12:48 PM Performed by: Alanda Amass A Pre-anesthesia Checklist: Patient identified, Timeout performed, Emergency Drugs available, Suction available and Patient being monitored Patient Re-evaluated:Patient Re-evaluated prior to inductionOxygen Delivery Method: Circle system utilized Preoxygenation: Pre-oxygenation with 100% oxygen Intubation Type: IV induction Ventilation: Mask ventilation without difficulty Laryngoscope Size: Mac and 3 Grade View: Grade I Tube type: Oral Tube size: 7.5 mm Number of attempts: 1 Airway Equipment and Method: Stylet Placement Confirmation: ETT inserted through vocal cords under direct vision,  breath sounds checked- equal and bilateral and positive ETCO2 Secured at: 21 cm Tube secured with: Tape Dental Injury: Teeth and Oropharynx as per pre-operative assessment

## 2013-06-30 NOTE — H&P (Signed)
  Elizabeth Welch is an 32 y.o. female.   Chief Complaint: Neck and right arm pain HPI: The patient is a 32 year old female who presented to the office with neck and right upper extremity discomfort. She tried and failed aggressive conservative therapy with medications and underwent imaging studies which showed significant disc abnormalities at C5-6 and C6-7. After discussing the options the patient requested surgery and now comes for a two-level anterior cervical discectomy with fusion and plating. I had a long discussion with her regarding the risks and benefits of surgical intervention. The risks discussed include but are not limited to bleeding infection weakness numbness paralysis spinal fluid leakage coma quadriplegia hoarseness and death. We have discussed alternative methods of therapy offered risks and benefits of nonintervention. She's had the opportunity to ask numerous questions and appears to understand. With this information in hand she has requested we proceed with surgery.  Past Medical History  Diagnosis Date  . Bursitis   . Hypertension   . Anxiety   . Arthritis   . Deviated septum   . Anginal pain     pt states due to anxiety; MD evaluation 05/2013 - pain felt related to cervical radiculopathy    Past Surgical History  Procedure Laterality Date  . Fractured vertebrate in neck    . Tubal ligation    . Ovarian cyst removal    . Cervix removal      states portioon  . Urethral dilation    . Heel spur surgery    . Appendectomy    . Wisdom tooth extraction      History reviewed. No pertinent family history. Social History:  reports that she has been smoking.  She does not have any smokeless tobacco history on file. She reports that she does not drink alcohol or use illicit drugs.  Allergies:  Allergies  Allergen Reactions  . Amlodipine Swelling and Other (See Comments)    Trouble breathing, vision problems  . Other     MUSHROOMS   CAUSE SWELLING     Medications  Prior to Admission  Medication Sig Dispense Refill  . cetirizine (ZYRTEC) 10 MG tablet Take 10 mg by mouth daily.      . fluticasone (FLONASE) 50 MCG/ACT nasal spray Place 2 sprays into the nose daily.      Marland Kitchen gabapentin (NEURONTIN) 100 MG capsule Take 100-200 mg by mouth 2 (two) times daily. 2 capsules (200 mg) every morning and 1 capsule (100 mg) every night      . metoprolol tartrate (LOPRESSOR) 25 MG tablet Take 25 mg by mouth 2 (two) times daily.       Marland Kitchen oxyCODONE-acetaminophen (PERCOCET/ROXICET) 5-325 MG per tablet Take 1-2 tablets by mouth every 6 (six) hours as needed for pain.  6 tablet  0    No results found for this or any previous visit (from the past 48 hour(s)). No results found.  Review of systems not obtained due to patient factors.  Blood pressure 132/91, pulse 73, temperature 98 F (36.7 C), temperature source Oral, resp. rate 18, last menstrual period 06/09/2013, SpO2 100.00%.  The patient is awake alert and oriented. She is no facial asymmetry. Her gait is nonantalgic. Reflexes are 1-2+ and equal. She is mild weakness of the triceps muscle on the right her sensation is intact Assessment/Plan Impression is that of disc abnormalities at C5-6 and C6-7. The plan is for a two-level anterior cervical discectomy with fusion and plating.  Reinaldo Meeker, MD 06/30/2013, 12:17 PM

## 2013-06-30 NOTE — Anesthesia Postprocedure Evaluation (Signed)
  Anesthesia Post-op Note  Patient: Elizabeth Welch  Procedure(s) Performed: Procedure(s) with comments: ANTERIOR CERVICAL DECOMPRESSION/DISCECTOMY FUSION 2 LEVELS (N/A) - ANTERIOR CERVICAL DECOMPRESSION/DISCECTOMY FUSION 2 LEVELS  Patient Location: PACU  Anesthesia Type:General  Level of Consciousness: awake, alert , oriented and patient cooperative  Airway and Oxygen Therapy: Patient Spontanous Breathing and Patient connected to nasal cannula oxygen  Post-op Pain: mild  Post-op Assessment: Post-op Vital signs reviewed, Patient's Cardiovascular Status Stable, Respiratory Function Stable, Patent Airway, No signs of Nausea or vomiting and Pain level controlled  Post-op Vital Signs: Reviewed and stable  Complications: No apparent anesthesia complications

## 2013-06-30 NOTE — Preoperative (Addendum)
Beta Blockers   Reason not to administer Beta Blockers:Pt. Took metoprolol @ 1000 8/1

## 2013-06-30 NOTE — Op Note (Signed)
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 Localizing fluoroscopy was used prior to incision to identify the appropriate level. Transverse incision was made in the right anterior neck started midline and headed towards the medial aspect of the sternal cremaster muscle. The platysma muscle was incised transversely. The natural fascial plane between the strap muscles medially and the sternal cremaster laterally was identified and followed down to the anterior aspect the cervical spine. Longus coli muscles were identified and split the midline and Triple-A bilaterally with Kitner section and unipolar coagulation. Subcutaneous tract was placed for exposure and excision approach the appropriate levels. Using a 15 blade the Clarendon disc at C5-6 and C6-7 was incised. Using pituitary rongeurs and curettes approximately 90% of the disc material was removed. High-speed drill was used to widen the interspace and bony shavings were saved for use later in the case. At this time the microscope was draped brought in the field and used for the remainder of the case. Starting C6-7 the remainder of the disc material down the posterior longitudinal ligament was removed. Ligament was incised transversely and the cut edges removed a Kerrison punch. Thorough decompression was carried out on the spinal dura into the foramen bilaterally. They're large amounts of herniated disc material bilaterally at this level but particularly towards the right, astigmatic side. This was removed without difficulty and gave very good visualization decompression of the underlying C7 nerve root. At this time  we turned our attention to C5-C6 were similar decompression was carried out. Once again we removed the posterior longitudinal ligament thoroughly decompressed the spinal dura and foramen bilaterally. The large amounts of herniated disc material towards the right side this was removed until the C6 root was well visualized well decompressed. At this time inspection was carried out at both levels for any evidence of residual compression and none could be identified. Irrigation was carried out and any bleeding control proper coagulation Gelfoam. Measurements were taken and 2 5 mm lordotic tobacco metal graft were chosen and filled with a mixture of autologous bone and morselized allograft. The plug impacted without difficulty and fossae showed to be in good position. An appropriately length intracervical plate was then chosen under fluoroscopic and guidance drill holes were placed followed by placing of 12 mm screws x6. Locking mechanism rotated locked position final fluoroscopy showed good position of the plates screws and plugs. Irrigation was carried out and any bleeding control proper coagulation. The was then closed with inverted Vicryl on the platysma muscle. We cemented 5-0 PDS in the subcuticular layer and Steri-Strips were placed on the skin. Sterile dressing a soft collar applied the patient was extubated and taken to recovery in stable condition.

## 2013-06-30 NOTE — Plan of Care (Signed)
Problem: Consults Goal: Diagnosis - Spinal Surgery Outcome: Completed/Met Date Met:  06/30/13 Cervical Spine Fusion     

## 2013-06-30 NOTE — Transfer of Care (Signed)
Immediate Anesthesia Transfer of Care Note  Patient: Elizabeth Welch  Procedure(s) Performed: Procedure(s) with comments: ANTERIOR CERVICAL DECOMPRESSION/DISCECTOMY FUSION 2 LEVELS (N/A) - ANTERIOR CERVICAL DECOMPRESSION/DISCECTOMY FUSION 2 LEVELS  Patient Location: PACU  Anesthesia Type:General  Level of Consciousness: sedated  Airway & Oxygen Therapy: Patient Spontanous Breathing and Patient connected to nasal cannula oxygen  Post-op Assessment: Report given to PACU RN and Post -op Vital signs reviewed and stable  Post vital signs: Reviewed and stable  Complications: No apparent anesthesia complications

## 2013-07-01 MED ORDER — OXYCODONE-ACETAMINOPHEN 5-325 MG PO TABS: 1.0000 | ORAL_TABLET | ORAL | Status: AC | PRN

## 2013-07-01 NOTE — Discharge Summary (Signed)
Physician Discharge Summary  Patient ID: Elizabeth Welch MRN: [id number] DOB/AGE: 05/01/1981 31 y.o.  Admit date: 06/30/2013 Discharge date: 07/01/2013  Admission Diagnoses:  Discharge Diagnoses:  Active Problems:   * No active hospital problems. *   Discharged Condition: good  Hospital Course: Surgery one day for two level acdf. Did well. No pain post op. Wound fine. Home pod 1, specific instructions given.  Consults: None  Significant Diagnostic Studies: none  Treatments: surgery: C 56 C 67 acdf with plating  Discharge Exam: Blood pressure 124/84, pulse 89, temperature 99.5 F (37.5 C), temperature source Oral, resp. rate 18, last menstrual period 06/09/2013, SpO2 96.00%. Incision/Wound:clean and dry  Disposition: 01-Home or Self Care  Discharge Orders   Future Orders Complete By Expires     Call MD for:  difficulty breathing, headache or visual disturbances  As directed     Call MD for:  hives  As directed     Call MD for:  persistant nausea and vomiting  As directed     Call MD for:  redness, tenderness, or signs of infection (pain, swelling, redness, odor or green/yellow discharge around incision site)  As directed     Call MD for:  severe uncontrolled pain  As directed     Call MD for:  temperature >100.4  As directed     Diet general  As directed     Discharge instructions  As directed     Comments:      Mostly bedrest. Get up 9 or 10 times each day and walk for 15-20 minutes each time. Very little sitting the first week. No riding in the car until your first post op appointment. If you had neck surgery...may shower from the chest down. If you had low back surgery....you may shower with a saran wrap covering over the incision. Take your pain medicine as needed...and other medicines that you are instructed to take. Call for an appointment...[phone].        Medication List         cetirizine 10 MG tablet  Commonly known as:  ZYRTEC  Take 10 mg by mouth daily.     fluticasone 50 MCG/ACT nasal spray  Commonly known as:  FLONASE  Place 2 sprays into the nose daily.     gabapentin 100 MG capsule  Commonly known as:  NEURONTIN  Take 100-200 mg by mouth 2 (two) times daily. 2 capsules (200 mg) every morning and 1 capsule (100 mg) every night     metoprolol tartrate 25 MG tablet  Commonly known as:  LOPRESSOR  Take 25 mg by mouth 2 (two) times daily.     oxyCODONE-acetaminophen 5-325 MG per tablet  Commonly known as:  PERCOCET/ROXICET  Take 1-2 tablets by mouth every 6 (six) hours as needed for pain.     oxyCODONE-acetaminophen 5-325 MG per tablet  Commonly known as:  PERCOCET/ROXICET  Take 1-2 tablets by mouth every 4 (four) hours as needed for pain.         At home rest most of the time. Get up 9 or 10 times each day and take a 15 or 20 minute walk. No riding in the car and to your first postoperative appointment. If you have neck surgery you may shower from the chest down starting on the third postoperative day. If you had back surgery he may start showering on the third postoperative day with saran wrap wrapped around your incisional area 3 times. After the shower remove the saran  wrap. Take pain medicine as needed and other medications as instructed. Call my office for an appointment.  SignedReinaldo Meeker, MD 07/01/2013, 8:31 AM

## 2013-07-01 NOTE — Progress Notes (Signed)
Pt. discharged home accompanied by husband. Prescriptions and discharge instructions given with verbalization of understanding. Incision site on neck with no s/s of infection - no swelling, redness, bleeding, and/or drainage noted. Soft collar intact. Opportunity given to ask questions but no question asked. Pt. transported out of this unit by the nurse tech. Aisha RN

## 2013-07-04 ENCOUNTER — Encounter (HOSPITAL_COMMUNITY): Payer: Self-pay | Admitting: Neurosurgery

## 2014-08-29 IMAGING — RF DG C-ARM 61-120 MIN
1 series · 1 of 1 positions shown · non-contrast
Comparison: Cervical MRI 06/13/2013

CLINICAL DATA: ACDF C5-6 and C6-7

DG C-ARM 1-60 MIN,DG CERVICAL SPINE - 1 VIEW
TECHNIQUE: lateral  image of the cervical spine

[Series 1: run · 1 of 1 slices shown]
[im 1/1]
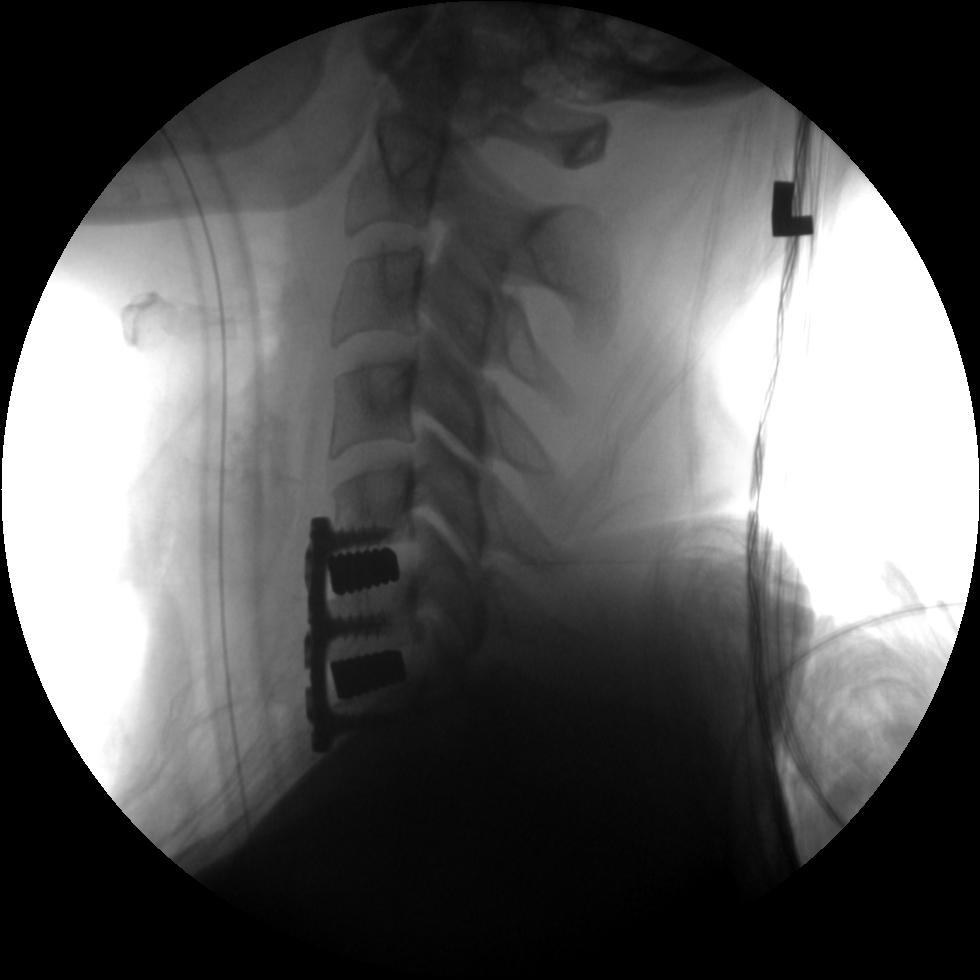

[1 of 1 positions shown; findings below may reference images not displayed]

FINDINGS: ACDF of C5-6 and C6-7.  Anterior plate and screws in good
position.  Interbody metal spacers  in good position at C5-6 and C6-
7.  No acute bony change.
IMPRESSION: Satisfactory ACDF C5-6 and C6-7.
